# Patient Record
Sex: Female | Born: 1975 | Race: Black or African American | Hispanic: No | Marital: Married | Smoking: Never smoker
Health system: Southern US, Community
[De-identification: ages and names within clinical notes are randomized; demographics above are authoritative.]

## PROBLEM LIST (undated history)

## (undated) DIAGNOSIS — J45909 Unspecified asthma, uncomplicated: Secondary | ICD-10-CM

---

## 2006-11-27 ENCOUNTER — Ambulatory Visit: Payer: Self-pay | Admitting: Unknown Physician Specialty

## 2006-11-28 ENCOUNTER — Ambulatory Visit: Payer: Self-pay | Admitting: Unknown Physician Specialty

## 2007-05-09 ENCOUNTER — Ambulatory Visit: Payer: Self-pay | Admitting: Unknown Physician Specialty

## 2007-08-03 ENCOUNTER — Emergency Department: Payer: Self-pay | Admitting: Emergency Medicine

## 2007-09-17 ENCOUNTER — Ambulatory Visit: Payer: Self-pay | Admitting: Specialist

## 2008-07-08 ENCOUNTER — Inpatient Hospital Stay: Payer: Self-pay

## 2008-09-02 ENCOUNTER — Observation Stay: Payer: Self-pay | Admitting: Obstetrics and Gynecology

## 2008-09-08 ENCOUNTER — Inpatient Hospital Stay: Payer: Self-pay

## 2008-10-20 ENCOUNTER — Observation Stay: Payer: Self-pay

## 2008-10-23 ENCOUNTER — Observation Stay: Payer: Self-pay

## 2008-10-27 ENCOUNTER — Observation Stay: Payer: Self-pay

## 2008-10-30 ENCOUNTER — Observation Stay: Payer: Self-pay

## 2008-11-03 ENCOUNTER — Observation Stay: Payer: Self-pay

## 2008-11-13 ENCOUNTER — Observation Stay: Payer: Self-pay

## 2008-11-17 ENCOUNTER — Observation Stay: Payer: Self-pay

## 2008-11-20 ENCOUNTER — Observation Stay: Payer: Self-pay

## 2008-11-26 ENCOUNTER — Ambulatory Visit: Payer: Self-pay | Admitting: Obstetrics and Gynecology

## 2008-11-27 ENCOUNTER — Inpatient Hospital Stay: Payer: Self-pay | Admitting: Obstetrics and Gynecology

## 2009-01-28 ENCOUNTER — Ambulatory Visit: Payer: Self-pay | Admitting: Anesthesiology

## 2009-02-07 ENCOUNTER — Ambulatory Visit: Payer: Self-pay | Admitting: Anesthesiology

## 2009-02-11 ENCOUNTER — Ambulatory Visit: Payer: Self-pay | Admitting: Anesthesiology

## 2009-02-28 ENCOUNTER — Ambulatory Visit: Payer: Self-pay | Admitting: Anesthesiology

## 2012-11-05 ENCOUNTER — Emergency Department: Payer: Self-pay | Admitting: Emergency Medicine

## 2012-11-06 LAB — URINALYSIS, COMPLETE
Glucose,UR: NEGATIVE mg/dL (ref 0–75)
Ketone: NEGATIVE
Leukocyte Esterase: NEGATIVE
Protein: NEGATIVE
RBC,UR: 1 /HPF (ref 0–5)
WBC UR: 6 /HPF (ref 0–5)

## 2012-11-06 LAB — COMPREHENSIVE METABOLIC PANEL
Alkaline Phosphatase: 98 U/L (ref 50–136)
Anion Gap: 8 (ref 7–16)
BUN: 13 mg/dL (ref 7–18)
Calcium, Total: 8.6 mg/dL (ref 8.5–10.1)
EGFR (African American): >60
Osmolality: 279 (ref 275–301)
Potassium: 3.9 mmol/L (ref 3.5–5.1)
SGOT(AST): 19 U/L (ref 15–37)
Total Protein: 7.5 g/dL (ref 6.4–8.2)

## 2012-11-06 LAB — CBC WITH DIFFERENTIAL/PLATELET
Basophil #: 0.1 10*3/uL (ref 0.0–0.1)
Eosinophil %: 2.7 %
HCT: 40.2 % (ref 35.0–47.0)
MCHC: 32.1 g/dL (ref 32.0–36.0)
MCV: 81 fL (ref 80–100)
Monocyte #: 0.4 x10 3/mm (ref 0.2–0.9)
Neutrophil %: 44.7 %
Platelet: 284 10*3/uL (ref 150–440)
RBC: 4.95 10*6/uL (ref 3.80–5.20)

## 2012-11-06 LAB — LIPASE, BLOOD: Lipase: 140 U/L (ref 73–393)

## 2012-11-06 LAB — RAPID INFLUENZA A&B ANTIGENS

## 2012-11-06 LAB — MONONUCLEOSIS SCREEN: Mono Test: NEGATIVE

## 2012-11-07 LAB — URINE CULTURE

## 2013-05-29 ENCOUNTER — Other Ambulatory Visit: Payer: Self-pay | Admitting: Bariatrics

## 2013-06-03 ENCOUNTER — Ambulatory Visit: Payer: Self-pay | Admitting: Bariatrics

## 2013-06-17 ENCOUNTER — Other Ambulatory Visit: Payer: Self-pay

## 2013-06-21 ENCOUNTER — Other Ambulatory Visit: Payer: 59

## 2013-06-27 ENCOUNTER — Ambulatory Visit: Payer: Self-pay | Admitting: Bariatrics

## 2013-07-08 ENCOUNTER — Ambulatory Visit
Admission: RE | Admit: 2013-07-08 | Discharge: 2013-07-08 | Disposition: A | Discharge status: Home / Self Care | Payer: 59 | Source: Ambulatory Visit | Attending: Bariatrics | Admitting: Bariatrics

## 2013-07-10 ENCOUNTER — Ambulatory Visit: Payer: Self-pay | Admitting: Bariatrics

## 2015-01-10 IMAGING — RF DG UGI W/ HIGH DENSITY W/KUB
12 of 16 series · 18 of 24 positions shown · non-contrast
Comparison: None.

FLUOROSCOPY TIME:  Two Min and 0 seconds

CLINICAL DATA: Morbid obesity

EXAM:
UPPER GI SERIES W/HIGH DENSITY W/KUB
TECHNIQUE: After obtaining a scout radiograph a routine upper GI series was
performed using thin barium and thick barium

[Series 1: run · 7 of 14 slices shown (1 of 11)]
[im 1/14]
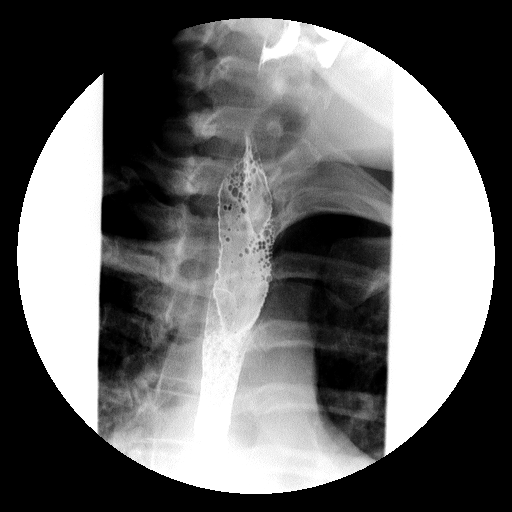
[im 4/14]
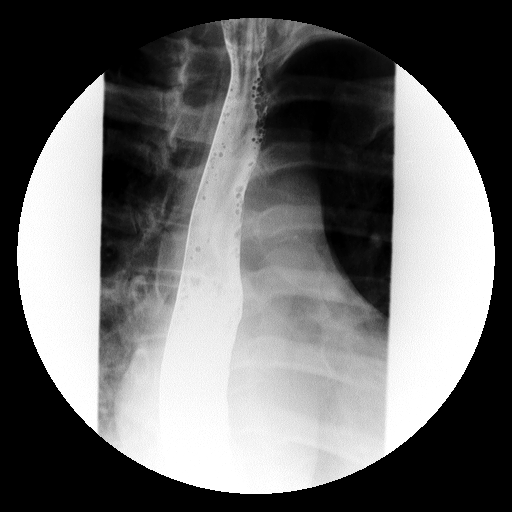
[im 5/14]
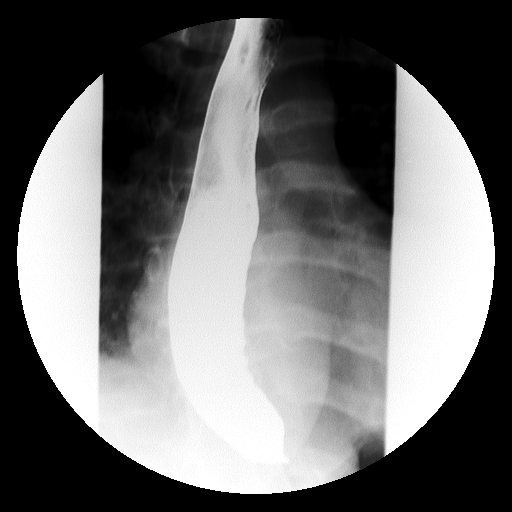
[im 7/14]
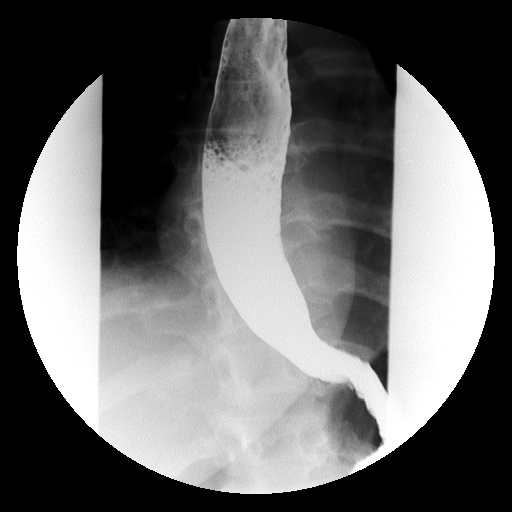
[im 10/14]
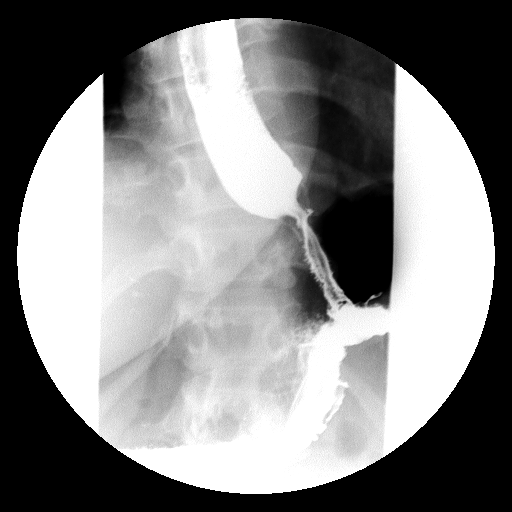
[im 12/14]
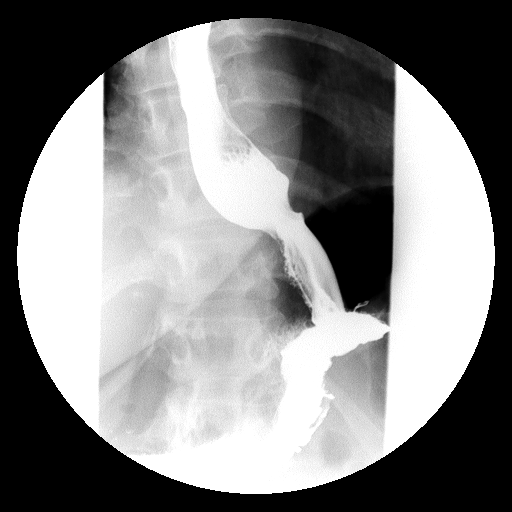
[im 14/14]
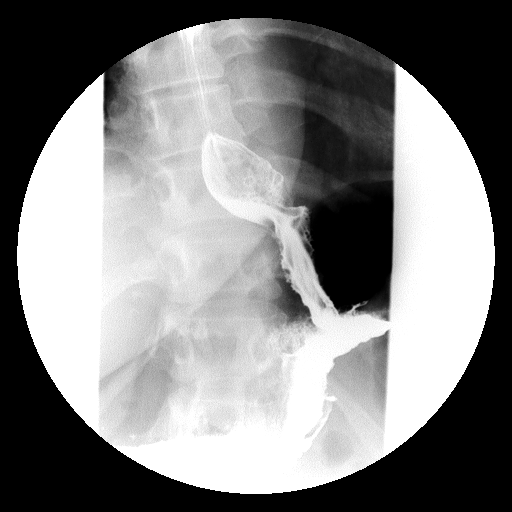

[Series 3: run · 1 of 1 slices shown (2 of 11)]
[im 1/1]
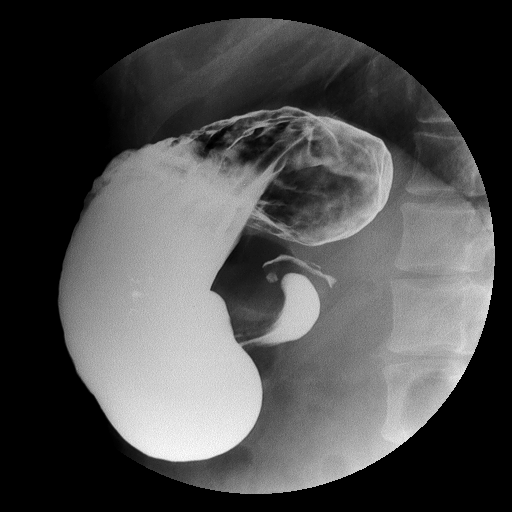

[Series 4: run · 1 of 1 slices shown (3 of 11)]
[im 1/1]
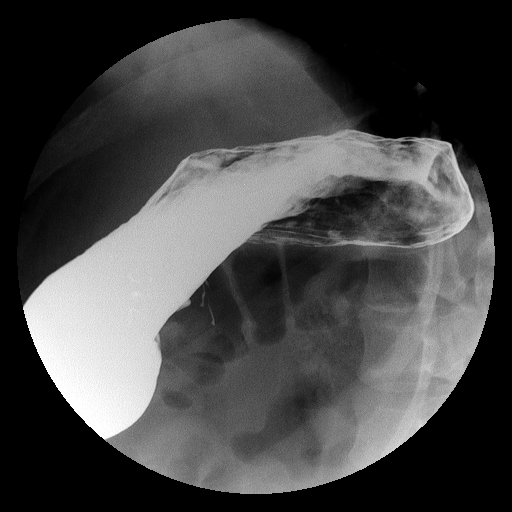

[Series 5: run · 1 of 1 slices shown (4 of 11)]
[im 1/1]
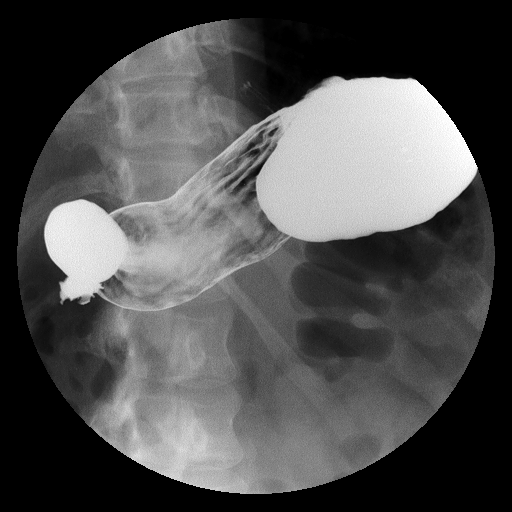

[Series 7: run · 1 of 1 slices shown (5 of 11)]
[im 1/1]
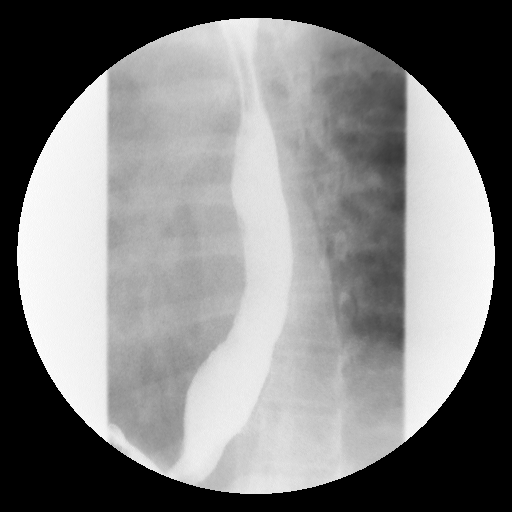

[Series 8: run · 1 of 1 slices shown (6 of 11)]
[im 1/1]
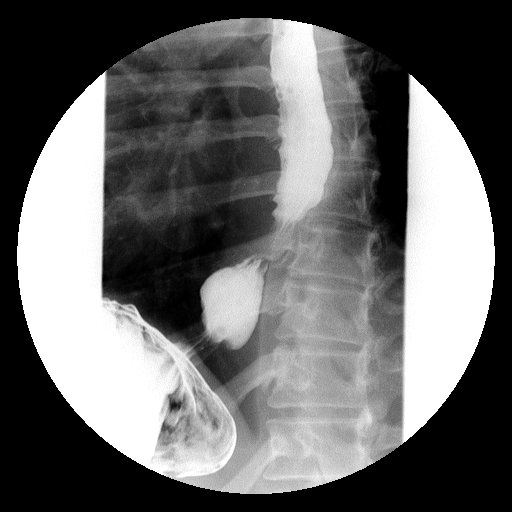

[Series 9: run · 1 of 1 slices shown (7 of 11)]
[im 1/1]
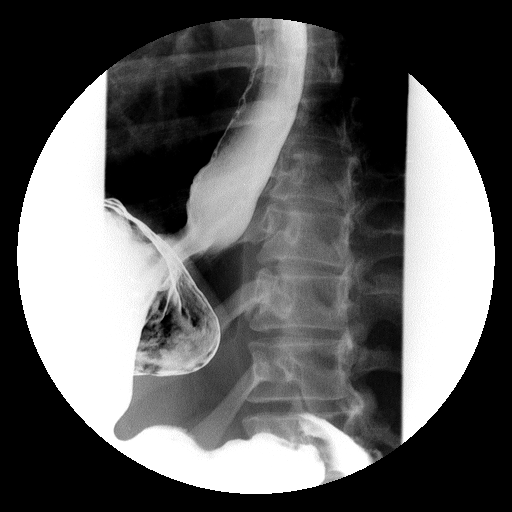

[Series 11: run · 1 of 1 slices shown (8 of 11)]
[im 1/1]
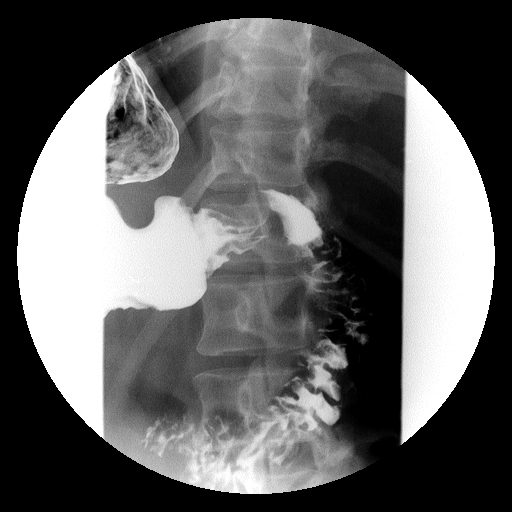

[Series 12: run · 1 of 1 slices shown (9 of 11)]
[im 1/1]
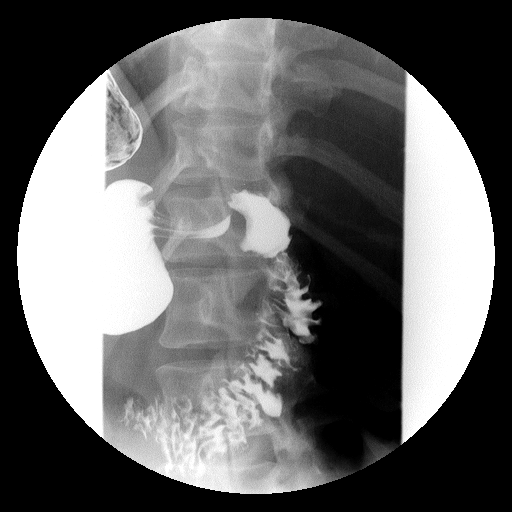

[Series 13: run · 1 of 1 slices shown (10 of 11)]
[im 1/1]
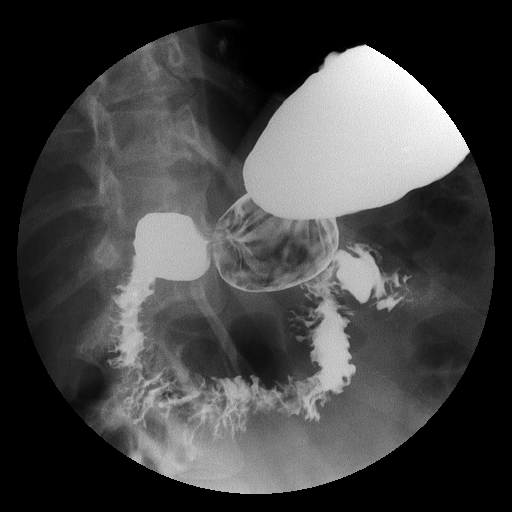

[Series 15: run · 1 of 1 slices shown (11 of 11)]
[im 1/1]
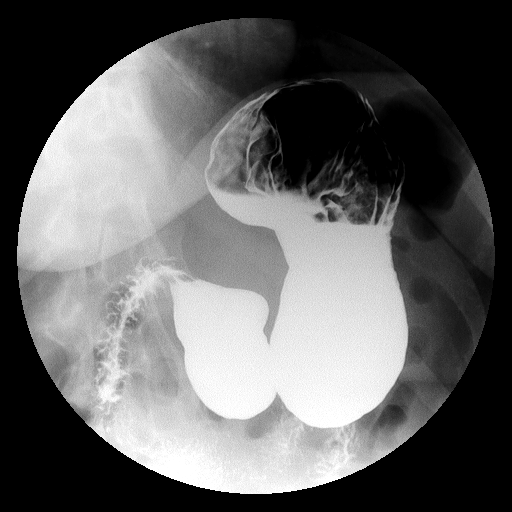

[Series 1001: view not recorded · 0.20mm/px · 1 of 1 slices shown]
[im 1/1]
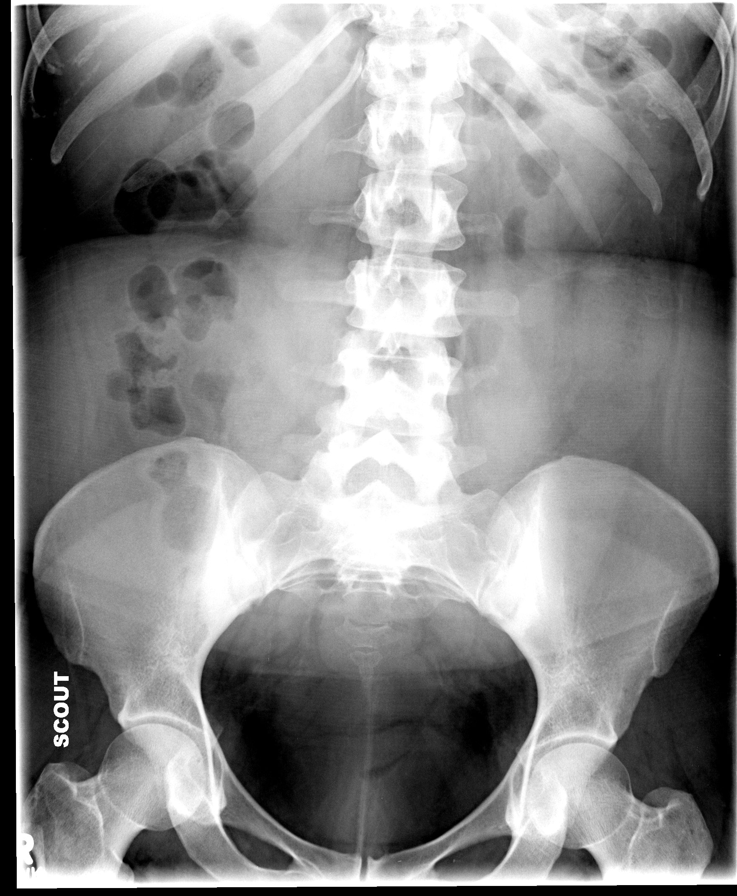

[18 of 24 positions shown; findings below may reference images not displayed]

FINDINGS: KUB is normal.

Esophageal mucosa and motility are normal. Negative for esophageal
stricture or mass. No hiatal hernia or reflux was identified. Small
hiatal hernia without reflux.

Stomach and duodenum bulb are normal. No ulcer mass or edema.
IMPRESSION: Small hiatal hernia without reflux.

## 2016-02-29 ENCOUNTER — Other Ambulatory Visit: Payer: Self-pay | Admitting: Nurse Practitioner

## 2016-02-29 DIAGNOSIS — Z1231 Encounter for screening mammogram for malignant neoplasm of breast: Secondary | ICD-10-CM

## 2016-03-11 ENCOUNTER — Ambulatory Visit: Payer: Self-pay | Attending: Nurse Practitioner

## 2018-03-12 ENCOUNTER — Ambulatory Visit: Payer: Self-pay | Admitting: Internal Medicine

## 2018-03-12 DIAGNOSIS — Z0289 Encounter for other administrative examinations: Secondary | ICD-10-CM

## 2019-05-22 ENCOUNTER — Other Ambulatory Visit: Payer: Self-pay

## 2019-05-22 DIAGNOSIS — Z20822 Contact with and (suspected) exposure to covid-19: Secondary | ICD-10-CM

## 2019-05-23 LAB — NOVEL CORONAVIRUS, NAA: SARS-CoV-2, NAA: NOT DETECTED

## 2020-01-08 ENCOUNTER — Other Ambulatory Visit: Payer: Self-pay | Admitting: Medical Oncology

## 2020-01-08 DIAGNOSIS — Z1231 Encounter for screening mammogram for malignant neoplasm of breast: Secondary | ICD-10-CM

## 2020-03-26 ENCOUNTER — Emergency Department: Payer: No Typology Code available for payment source

## 2020-03-26 ENCOUNTER — Other Ambulatory Visit: Payer: Self-pay

## 2020-03-26 ENCOUNTER — Encounter: Payer: Self-pay | Admitting: Emergency Medicine

## 2020-03-26 ENCOUNTER — Emergency Department
Admission: EM | Admit: 2020-03-26 | Discharge: 2020-03-26 | Disposition: A | Discharge status: Home / Self Care | Payer: No Typology Code available for payment source | Attending: Emergency Medicine | Admitting: Emergency Medicine

## 2020-03-26 DIAGNOSIS — Y9389 Activity, other specified: Secondary | ICD-10-CM | POA: Insufficient documentation

## 2020-03-26 DIAGNOSIS — W010XXA Fall on same level from slipping, tripping and stumbling without subsequent striking against object, initial encounter: Secondary | ICD-10-CM | POA: Diagnosis not present

## 2020-03-26 DIAGNOSIS — Y929 Unspecified place or not applicable: Secondary | ICD-10-CM | POA: Insufficient documentation

## 2020-03-26 DIAGNOSIS — S93402A Sprain of unspecified ligament of left ankle, initial encounter: Secondary | ICD-10-CM | POA: Diagnosis not present

## 2020-03-26 DIAGNOSIS — Y990 Civilian activity done for income or pay: Secondary | ICD-10-CM | POA: Diagnosis not present

## 2020-03-26 DIAGNOSIS — S99912A Unspecified injury of left ankle, initial encounter: Secondary | ICD-10-CM | POA: Diagnosis present

## 2020-03-26 HISTORY — DX: Unspecified asthma, uncomplicated: J45.909

## 2020-03-26 MED ORDER — HYDROCODONE-ACETAMINOPHEN 5-325 MG PO TABS: 1.0000 | ORAL_TABLET | Freq: Four times a day (QID) | ORAL | 0 refills | Status: AC | PRN

## 2020-03-26 MED ORDER — IBUPROFEN 600 MG PO TABS: 600.0000 mg | ORAL_TABLET | Freq: Three times a day (TID) | ORAL | 0 refills | Status: AC | PRN

## 2020-03-26 NOTE — ED Notes (Signed)
See triage note  Presents with injury to left ankle  States she rolled her ankle at work last pm  Having pain with some swelling just above ankle into ankle  Good pulses

## 2020-03-26 NOTE — Discharge Instructions (Signed)
Ice and elevate your ankle as needed for pain and swelling.  Wear the brace anytime you are up walking and use crutches for added support and protection.  Ibuprofen every 6 hours with food was sent to your pharmacy.  A prescription for hydrocodone which is a narcotic was sent to the pharmacy if you need this to help you sleep.  Do not take this medication and drive or operate machinery or go to work while taking this medication.  If not improving you will need to call make an appointment with Dr. Joice Lofts who is the orthopedist on call.

## 2020-03-26 NOTE — ED Provider Notes (Signed)
Left  [facility] Emergency Department Provider Note  ____________________________________________   First MD Initiated Contact with Patient 03/26/20 [phone]     (approximate)  I have reviewed the triage vital signs and the nursing notes.   HISTORY  Chief Complaint Ankle Injury   HPI Nicole Mooney is a 44 y.o. female presents to the ED with complaint of left ankle pain.  Patient states that she slipped on a plastic bag that was left in the hallway at work last night.  She denies any head injury or loss of consciousness.  Patient has continued to have ankle pain since that time.  She rates her pain as a 9 out of 10.        Past Medical History:  Diagnosis Date  . Asthma     There are no problems to display for this patient.   History reviewed. No pertinent surgical history.  Prior to Admission medications   Medication Sig Start Date End Date Taking? Authorizing Provider  HYDROcodone-acetaminophen (NORCO/VICODIN) 5-325 MG tablet Take 1 tablet by mouth every 6 (six) hours as needed for moderate pain. 03/26/20   Tommi Rumps, PA-C  ibuprofen (ADVIL) 600 MG tablet Take 1 tablet (600 mg total) by mouth every 8 (eight) hours as needed. 03/26/20   Tommi Rumps, PA-C    Allergies Prednisone  No family history on file.  Social History Social History   Tobacco Use  . Smoking status: Never Smoker  . Smokeless tobacco: Never Used  Substance Use Topics  . Alcohol use: Not on file  . Drug use: Not on file    Review of Systems Constitutional: No fever/chills Cardiovascular: Denies chest pain. Respiratory: Denies shortness of breath. Gastrointestinal:   No nausea, no vomiting. . Musculoskeletal: Positive left ankle pain. Skin: Negative for rash. Neurological: Negative for headaches, focal weakness or numbness. ____________________________________________   PHYSICAL EXAM:  VITAL SIGNS: ED Triage Vitals  Enc Vitals Group     BP  03/26/20 0720 115/63     Pulse Rate 03/26/20 0720 78     Resp 03/26/20 0720 18     Temp 03/26/20 0720 98 F (36.7 C)     Temp Source 03/26/20 0720 Oral     SpO2 03/26/20 0720 100 %     Weight 03/26/20 0300 189 lb (85.7 kg)     Height 03/26/20 0300 5\' 1"  (1.549 m)     Head Circumference --      Peak Flow --      Pain Score 03/26/20 0300 9     Pain Loc --      Pain Edu? --      Excl. in GC? --     Constitutional: Alert and oriented. Well appearing and in no acute distress. Eyes: Conjunctivae are normal.  Head: Atraumatic. Neck: No stridor.   Cardiovascular: Normal rate, regular rhythm. Grossly normal heart sounds.  Good peripheral circulation. Respiratory: Normal respiratory effort.  No retractions. Lungs CTAB. Gastrointestinal: Soft and nontender. No distention.  Musculoskeletal: Examination of the ankle there is soft tissue edema noted bilaterally.  Patient is tender to palpation.  Skin is intact.  No ecchymosis or abrasions were seen.  Pulses present.  Patient is able to move digits distally without any difficulty and motor sensory function intact. Neurologic:  Normal speech and language. No gross focal neurologic deficits are appreciated.  Skin:  Skin is warm, dry and intact.  No ecchymosis, abrasions or erythema noted. Psychiatric: Mood and affect are normal.  Speech and behavior are normal.  ____________________________________________   LABS (all labs ordered are listed, but only abnormal results are displayed)  Labs Reviewed - No data to display ____________________________________________ _  RADIOLOGY   Official radiology report(s): DG Ankle Complete Left  Result Date: 03/26/2020 CLINICAL DATA:  Injury EXAM: LEFT ANKLE COMPLETE - 3+ VIEW COMPARISON:  None. FINDINGS: There is no evidence of fracture, dislocation, or joint effusion. There is no evidence of arthropathy or other focal bone abnormality. An os trigonum is present. Diffuse soft tissue swelling seen over the  lateral malleolus. IMPRESSION: No acute osseous abnormality. Soft tissue swelling over the lateral malleolus. Electronically Signed   By: Prudencio Pair M.D.   On: 03/26/2020 04:54    ____________________________________________   PROCEDURES  Procedure(s) performed (including Critical Care):  Procedures Ankle stirrup splint and crutches per ED tech.  ____________________________________________   INITIAL IMPRESSION / ASSESSMENT AND PLAN / ED COURSE  As part of my medical decision making, I reviewed the following data within the electronic MEDICAL RECORD NUMBER Notes from prior ED visits and [place] Controlled Substance Database  44 year old female presents to the ED with a Workmen's Comp. injury that occurred when she slipped on a plastic bag that was on the floor.  Patient states that she has had pain and swelling to her ankle since that time.  She denies any head injury or loss of consciousness.  Soft tissue edema is present.  X-rays are negative for acute bony injury.  Patient was placed in a stirrup splint and given crutches.  She is instructed to ice and elevate often to reduce swelling.  She was given prescription for ibuprofen 600 mg every 8 hours and hydrocodone if needed especially at night to sleep.  She is aware that she cannot take this medication and drive, operate machinery or work.  A note was given to her to take to work with work restrictions on this.  She is to follow-up with Dr. Roland Rack if any continued problems with her ankle or doctor that the company prefers for Workmen's Comp.  ____________________________________________   FINAL CLINICAL IMPRESSION(S) / ED DIAGNOSES  Final diagnoses:  Sprain of left ankle, unspecified ligament, initial encounter     ED Discharge Orders         Ordered    ibuprofen (ADVIL) 600 MG tablet  Every 8 hours PRN     Discontinue  Reprint     03/26/20 0752    HYDROcodone-acetaminophen (NORCO/VICODIN) 5-325 MG tablet  Every 6 hours PRN      Discontinue  Reprint     03/26/20 0752           Note:  This document was prepared using Dragon voice recognition software and may include unintentional dictation errors.    Johnn Hai, PA-C 03/26/20 1038    Duffy Bruce, MD 03/30/20 [phone]

## 2020-03-26 NOTE — ED Triage Notes (Signed)
Pt to triage via w/c with no distress noted; reports while working ([employer]), "someone left roll of plastic in floor causing her to roll her ankle"; pain/swelling to ankle; works comp profile indicates UDS and blood alcohol required

## 2021-09-28 IMAGING — CR DG ANKLE COMPLETE 3+V*L*
3 series · 3 of 3 positions shown · non-contrast
Comparison: None.

CLINICAL DATA: Injury

EXAM:
LEFT ANKLE COMPLETE - 3+ VIEW

[ankle ap]
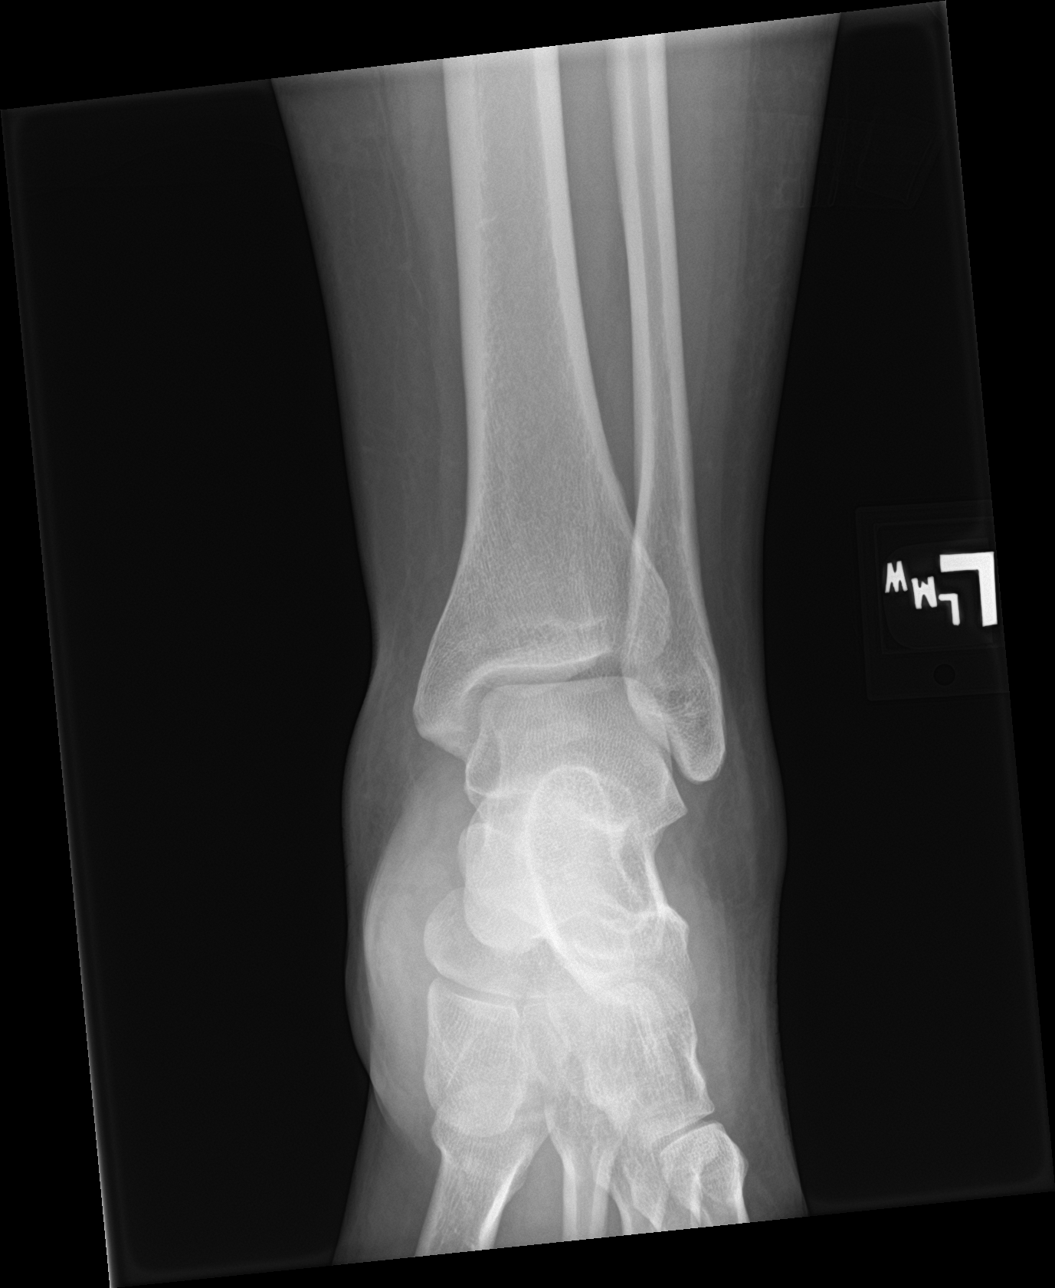

[ankle obl]
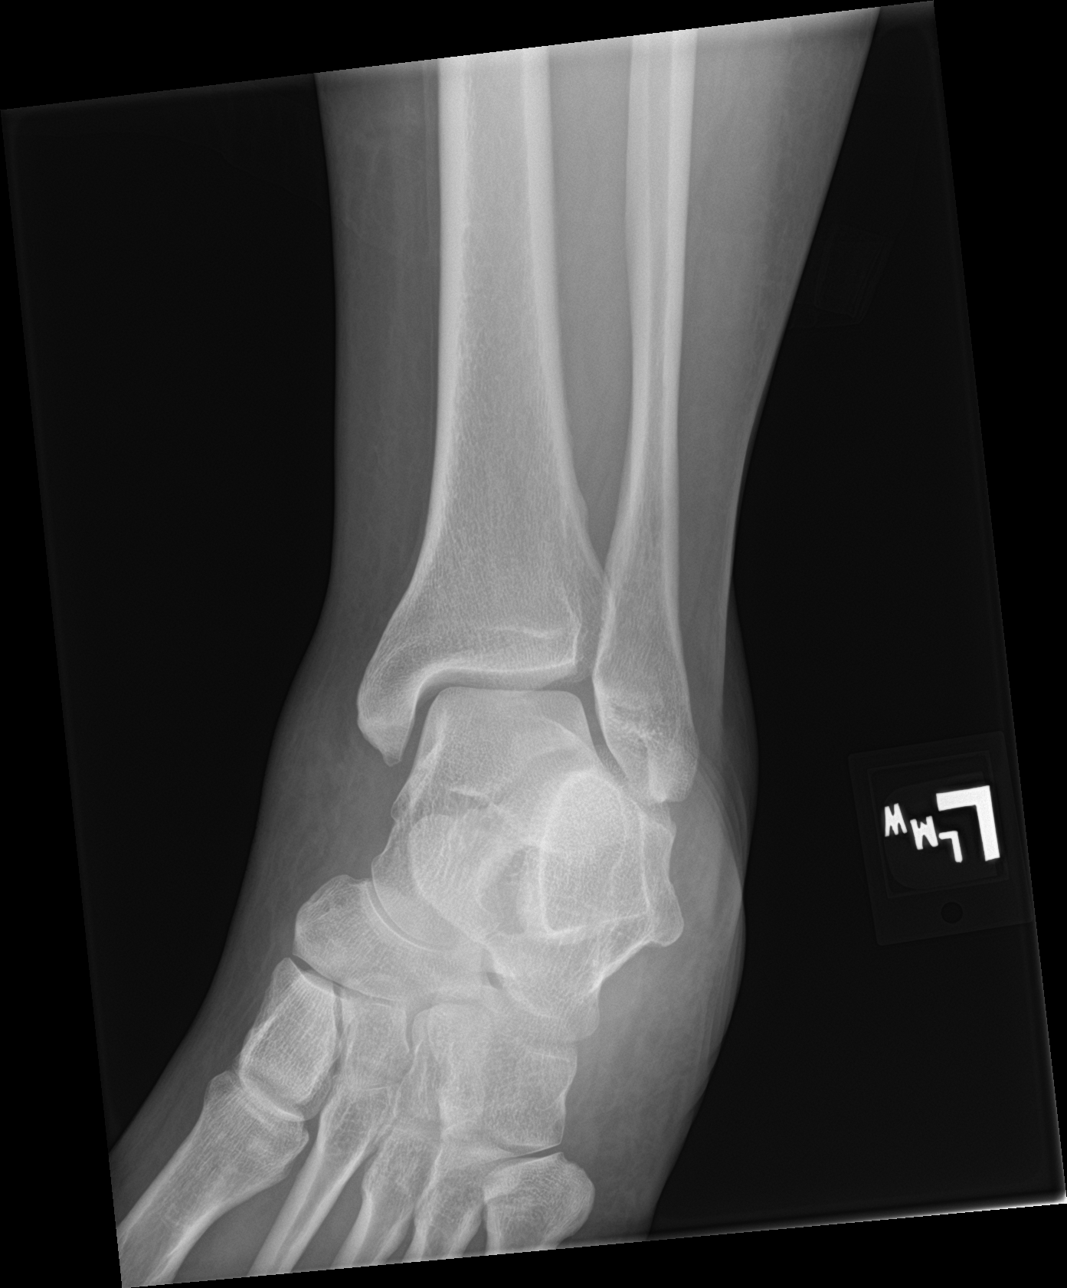

[ankle lat]
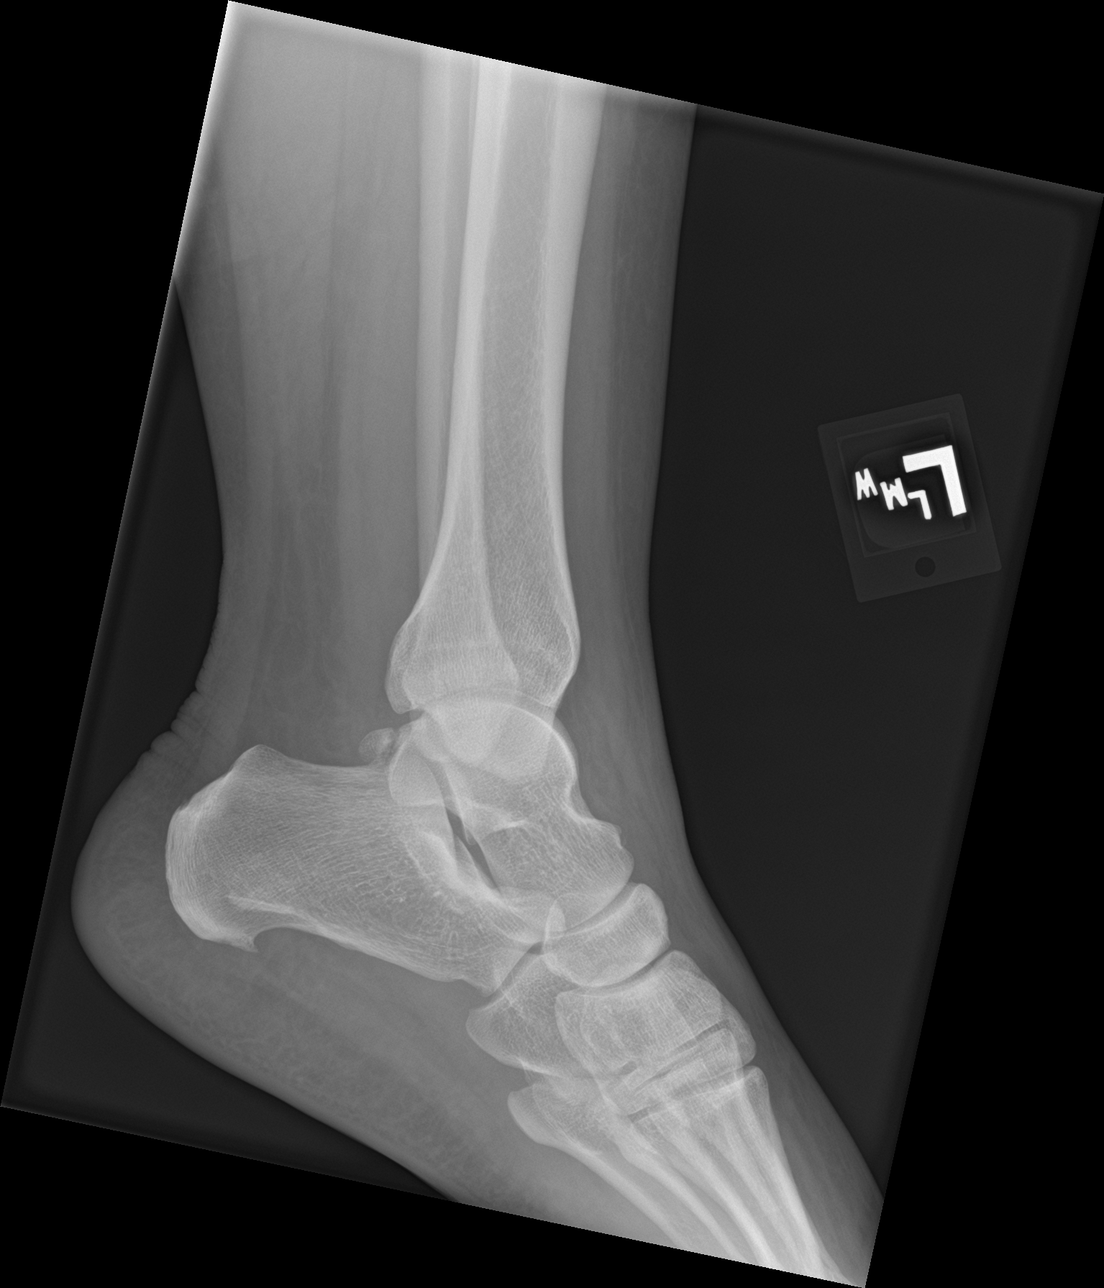

[3 of 3 positions shown; findings below may reference images not displayed]

FINDINGS: There is no evidence of fracture, dislocation, or joint effusion.
There is no evidence of arthropathy or other focal bone abnormality.
An os trigonum is present. Diffuse soft tissue swelling seen over
the lateral malleolus.
IMPRESSION: No acute osseous abnormality. Soft tissue swelling over the lateral
malleolus.

## 2023-02-02 ENCOUNTER — Other Ambulatory Visit: Payer: Self-pay | Admitting: Internal Medicine

## 2023-02-02 DIAGNOSIS — R1011 Right upper quadrant pain: Secondary | ICD-10-CM

## 2023-02-03 ENCOUNTER — Ambulatory Visit
Admission: RE | Admit: 2023-02-03 | Discharge: 2023-02-03 | Disposition: A | Discharge status: Home / Self Care | Payer: BC Managed Care – PPO | Source: Ambulatory Visit | Attending: Internal Medicine | Admitting: Internal Medicine

## 2023-02-03 DIAGNOSIS — R1011 Right upper quadrant pain: Secondary | ICD-10-CM
